# Patient Record
Sex: Female | Born: 1965 | Race: White | Hispanic: No | Marital: Married
Health system: Midwestern US, Academic
[De-identification: ages and names within clinical notes are randomized; demographics above are authoritative.]

---

## 2017-02-19 ENCOUNTER — Encounter: Admit: 2017-02-19 | Discharge: 2017-02-19 | Payer: BC Managed Care – PPO

## 2017-02-19 ENCOUNTER — Observation Stay: Admit: 2017-02-19 | Discharge: 2017-02-20 | Payer: BC Managed Care – PPO

## 2017-02-19 ENCOUNTER — Emergency Department: Admit: 2017-02-19 | Discharge: 2017-02-19 | Payer: BC Managed Care – PPO

## 2017-02-19 DIAGNOSIS — E039 Hypothyroidism, unspecified: ICD-10-CM

## 2017-02-19 DIAGNOSIS — C50919 Malignant neoplasm of unspecified site of unspecified female breast: ICD-10-CM

## 2017-02-19 DIAGNOSIS — C801 Malignant (primary) neoplasm, unspecified: Principal | ICD-10-CM

## 2017-02-19 DIAGNOSIS — C569 Malignant neoplasm of unspecified ovary: ICD-10-CM

## 2017-02-19 LAB — POC TROPONIN: Lab: 0 ng/mL (ref 0.00–0.05)

## 2017-02-19 LAB — COMPREHENSIVE METABOLIC PANEL: Lab: 127 MMOL/L — ABNORMAL LOW (ref 137–147)

## 2017-02-19 LAB — URINALYSIS DIPSTICK
Lab: 7 mg/dL (ref 5.0–8.0)
Lab: NEGATIVE g/dL (ref 6.0–8.0)
Lab: NEGATIVE mg/dL (ref 0.4–1.00)
Lab: NEGATIVE mg/dL (ref 8.5–10.6)

## 2017-02-19 LAB — CBC AND DIFF: Lab: 11 10*3/uL (ref 4.5–11.0)

## 2017-02-19 LAB — URINALYSIS, MICROSCOPIC

## 2017-02-19 MED ORDER — SODIUM CHLORIDE 0.9 % IJ SOLN
50 mL | Freq: Once | INTRAVENOUS | 0 refills | Status: CP
Start: 2017-02-19 — End: ?
  Administered 2017-02-20: 01:00:00 50 mL via INTRAVENOUS

## 2017-02-19 MED ORDER — POTASSIUM CHLORIDE IN 0.9%NACL 20 MEQ/L IV SOLP
1000 mL | INTRAVENOUS | 0 refills | Status: DC
Start: 2017-02-19 — End: 2017-02-20
  Administered 2017-02-20: 05:00:00 1000 mL via INTRAVENOUS

## 2017-02-19 MED ORDER — ENOXAPARIN 40 MG/0.4 ML SC SYRG
40 mg | Freq: Every day | SUBCUTANEOUS | 0 refills | Status: DC
Start: 2017-02-19 — End: 2017-02-20

## 2017-02-19 MED ORDER — POTASSIUM CHLORIDE 20 MEQ PO TBTQ
20 meq | Freq: Every day | ORAL | 0 refills | Status: DC
Start: 2017-02-19 — End: 2017-02-20
  Administered 2017-02-20: 14:00:00 20 meq via ORAL

## 2017-02-19 MED ORDER — ONDANSETRON HCL (PF) 4 MG/2 ML IJ SOLN
4 mg | INTRAVENOUS | 0 refills | Status: DC | PRN
Start: 2017-02-19 — End: 2017-02-20

## 2017-02-19 MED ORDER — LEVOTHYROXINE 100 MCG PO TAB
100 ug | Freq: Every day | ORAL | 0 refills | Status: DC
Start: 2017-02-19 — End: 2017-02-20
  Administered 2017-02-20: 12:00:00 100 ug via ORAL

## 2017-02-19 MED ORDER — IOPAMIDOL 76 % IV SOLN
100 mL | Freq: Once | INTRAVENOUS | 0 refills | Status: CP
Start: 2017-02-19 — End: ?
  Administered 2017-02-20: 01:00:00 100 mL via INTRAVENOUS

## 2017-02-19 MED ORDER — ACETAMINOPHEN 325 MG PO TAB
650 mg | ORAL | 0 refills | Status: DC | PRN
Start: 2017-02-19 — End: 2017-02-20

## 2017-02-19 MED ORDER — SODIUM CHLORIDE 0.9 % IV SOLP
1000 mL | INTRAVENOUS | 0 refills | Status: CP
Start: 2017-02-19 — End: ?
  Administered 2017-02-19: 23:00:00 1000 mL via INTRAVENOUS

## 2017-02-19 MED ORDER — CEPHALEXIN 500 MG PO CAP
500 mg | Freq: Two times a day (BID) | ORAL | 0 refills | Status: DC
Start: 2017-02-19 — End: 2017-02-20
  Administered 2017-02-20 (×2): 500 mg via ORAL

## 2017-02-19 MED ORDER — DOCUSATE SODIUM 100 MG PO CAP
100 mg | Freq: Every day | ORAL | 0 refills | Status: DC | PRN
Start: 2017-02-19 — End: 2017-02-20

## 2017-02-20 ENCOUNTER — Observation Stay: Admit: 2017-02-20 | Discharge: 2017-02-20 | Payer: BC Managed Care – PPO

## 2017-02-20 ENCOUNTER — Emergency Department: Admit: 2017-02-19 | Discharge: 2017-02-19 | Payer: BC Managed Care – PPO

## 2017-02-20 DIAGNOSIS — N390 Urinary tract infection, site not specified: ICD-10-CM

## 2017-02-20 DIAGNOSIS — R55 Syncope and collapse: ICD-10-CM

## 2017-02-20 DIAGNOSIS — Z9221 Personal history of antineoplastic chemotherapy: ICD-10-CM

## 2017-02-20 DIAGNOSIS — E871 Hypo-osmolality and hyponatremia: Principal | ICD-10-CM

## 2017-02-20 DIAGNOSIS — Z8543 Personal history of malignant neoplasm of ovary: ICD-10-CM

## 2017-02-20 DIAGNOSIS — K7689 Other specified diseases of liver: ICD-10-CM

## 2017-02-20 DIAGNOSIS — E039 Hypothyroidism, unspecified: ICD-10-CM

## 2017-02-20 DIAGNOSIS — Z853 Personal history of malignant neoplasm of breast: ICD-10-CM

## 2017-02-20 LAB — BASIC METABOLIC PANEL
Lab: 0.8 mg/dL (ref 0.4–1.00)
Lab: 108 MMOL/L (ref 98–110)
Lab: 113 mg/dL — ABNORMAL HIGH (ref 70–100)
Lab: 137 MMOL/L (ref 137–147)
Lab: 16 mg/dL (ref 7–25)
Lab: 24 MMOL/L (ref 21–30)
Lab: 3.3 MMOL/L — ABNORMAL LOW (ref 3.5–5.1)
Lab: 7.8 mg/dL — ABNORMAL LOW (ref 8.5–10.6)
Lab: >60 mL/min (ref >60)
Lab: >60 mL/min (ref >60)
Lab: 109 MMOL/L (ref 98–110)
Lab: 125 mg/dL — ABNORMAL HIGH (ref 70–100)
Lab: 138 MMOL/L (ref 137–147)
Lab: 25 MMOL/L (ref 21–30)
Lab: 4.3 MMOL/L (ref 3.5–5.1)
Lab: 5 (ref 3–12)
Lab: 56 mL/min — ABNORMAL LOW (ref >60)
Lab: >60 mL/min (ref >60)
Lab: 0.9 mg/dL (ref 0.4–1.00)
Lab: 1 mg/dL — ABNORMAL HIGH (ref 0.4–1.00)
Lab: 105 MMOL/L (ref 98–110)
Lab: 107 mg/dL — ABNORMAL HIGH (ref 70–100)
Lab: 109 MMOL/L (ref 98–110)
Lab: 128 mg/dL — ABNORMAL HIGH (ref 70–100)
Lab: 137 MMOL/L (ref 137–147)
Lab: 139 MMOL/L (ref 137–147)
Lab: 16 mg/dL (ref 7–25)
Lab: 17 mg/dL (ref 7–25)
Lab: 24 MMOL/L (ref 21–30)
Lab: 26 MMOL/L (ref 21–30)
Lab: 4.3 MMOL/L (ref 3.5–5.1)
Lab: 4.4 MMOL/L (ref 3.5–5.1)
Lab: 58 mL/min — ABNORMAL LOW (ref >60)
Lab: 59 mL/min — ABNORMAL LOW (ref >60)
Lab: 9.2 mg/dL (ref 8.5–10.6)
Lab: 9.6 mg/dL (ref 8.5–10.6)
Lab: >60 mL/min (ref >60)
Lab: >60 mL/min (ref >60)
Lab: 6 (ref 3–12)
Lab: 6 (ref 3–12)

## 2017-02-20 LAB — CULTURE-URINE W/SENSITIVITY

## 2017-02-20 LAB — PHOSPHORUS
Lab: 3 mg/dL (ref 2.0–4.0)
Lab: 3.6 mg/dL — ABNORMAL HIGH (ref 2.0–4.0)

## 2017-02-20 LAB — CBC AND DIFF
Lab: 1 % (ref 0–2)
Lab: 1 % (ref 0–5)
Lab: 12 g/dL (ref 12.0–15.0)
Lab: 13 % (ref 11–15)
Lab: 18 % — ABNORMAL LOW (ref 24–44)
Lab: 217 10*3/uL (ref 150–400)
Lab: 30 pg (ref 26–34)
Lab: 33 g/dL (ref 32.0–36.0)
Lab: 37 % (ref 36–45)
Lab: 4.1 M/UL (ref 4.0–5.0)
Lab: 5.7 10*3/uL (ref 1.8–7.0)
Lab: 6 % (ref 4–12)
Lab: 7.6 10*3/uL (ref 4.5–11.0)
Lab: 7.8 FL (ref 7–11)
Lab: 74 % (ref 41–77)
Lab: 90 FL (ref 80–100)

## 2017-02-20 LAB — MAGNESIUM
Lab: 1.9 mg/dL (ref 1.6–2.6)
Lab: 2.1 mg/dL — ABNORMAL LOW (ref 1.6–2.6)

## 2017-02-20 LAB — OSMOLALITY: Lab: 266 mosm/kg — ABNORMAL LOW (ref 280–307)

## 2017-02-20 LAB — CREATININE-URINE RANDOM: Lab: 18 mg/dL

## 2017-02-20 LAB — OSMOLALITY-URINE RANDOM: Lab: 142 mosm/kg (ref 50–1400)

## 2017-02-20 LAB — CORTISOL,RANDOM: Lab: 7.4 ug/dL (ref 5.0–20.0)

## 2017-02-20 LAB — TSH WITH FREE T4 REFLEX: Lab: 1.1 uU/mL (ref 0.35–5.00)

## 2017-02-20 LAB — SODIUM-URINE RANDOM: Lab: 23 MMOL/L

## 2017-02-20 MED ORDER — DEXTROSE 5% IN WATER IV SOLP
INTRAVENOUS | 0 refills | Status: DC
Start: 2017-02-20 — End: 2017-02-20
  Administered 2017-02-20: 07:00:00 1000.000 mL via INTRAVENOUS

## 2017-02-20 MED ORDER — CEPHALEXIN 500 MG PO CAP
500 mg | ORAL_CAPSULE | Freq: Two times a day (BID) | ORAL | 0 refills | Status: AC
Start: 2017-02-20 — End: ?

## 2017-02-20 MED ORDER — PERFLUTREN LIPID MICROSPHERES 1.1 MG/ML IV SUSP
1-20 mL | Freq: Once | INTRAVENOUS | 0 refills | Status: CP
Start: 2017-02-20 — End: ?
  Administered 2017-02-20: 16:00:00 2 mL via INTRAVENOUS

## 2017-02-20 MED ORDER — POTASSIUM CHLORIDE 20 MEQ PO TBTQ
20 meq | Freq: Once | ORAL | 0 refills | Status: CP
Start: 2017-02-20 — End: ?
  Administered 2017-02-20: 07:00:00 20 meq via ORAL

## 2017-02-20 NOTE — Case Management (ED)
Case Management Admission Assessment    NAME:Robin Jackson                          MRN: [id number]             DOB:08-Dec-1965          AGE: 51 y.o.  ADMISSION DATE: 02/19/2017             DAYS ADMITTED: LOS: 0 days      Today???s Date: 02/20/2017    Source of Information: Patient and EMR    Plan  Plan: CM Assessment, Psychosocial Assessment, Assist PRN with SW/NCM Services, Discharge Planning for Home Anticipated    Patient Address/Phone  [address]  [name] [address]  [phone] (home)     Emergency Contact  Extended Emergency Contact Information  Primary Emergency Contact: [name], [name]  Home Phone: [phone]  Relation: Spouse    Healthcare Directive  Healthcare Directive: Yes, patient has a healthcare directive  Type of Healthcare Directive: Durable power of attorney for healthcare  Location of Healthcare Directive: Patient does not have it with him/her  Would patient like to fill out a (a new) Healthcare Directive?: No, patient declined      Transportation  Does the patient need discharge transport arranged?: No  Transportation Name, Phone and Availability #1: Husband [name] [phone]  Does the patient use Medicaid Transportation?: No    Expected Discharge Date  Expected Discharge Date: 02/20/17    Living Situation Prior to Admission  ? Living Arrangements  Type of Residence: Home, independent  Living Arrangements: Spouse/significant other  Assistance needed prior to admit or anticipated on discharge: No  Who provides assistance or could if needed?: [name]  Are they in good health?: Yes  Can support system provide 24/7 care if needed?: Yes   Address and phone numbers verified.    ? Level of Function   Prior level of function: Independent  ? Cognitive Abilities   Cognitive Abilities: Alert and Oriented, Engages in problem solving and planning, Participates in decision making    Financial Resources  ? Coverage  Primary Insurance: [employer] Coverage: RX    ? 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 Financial Assistance Needed?      Psychosocial Needs  ? Mental Health  Mental Health History: No  ? Substance Use History  Substance Use History Screen: No  ? Other      Current/Previous Services  ? PCP  Orson Gear, [phone], [phone]  ? Pharmacy    [facility] [address], [place] - [address] [place] [address] [place] [address]  [place] [address]  Phone: [phone] Fax: [phone]    ? Durable Medical Equipment   Durable Medical Equipment at home: None  ? Home Health  Receiving home health: No  ? Hemodialysis or Peritoneal Dialysis  Undergoing hemodialysis or peritoneal dialysis: No  ? Tube/Enteral Feeds  Receive tube/enteral feeds: No  ? Infusion  Receive infusions: No  ? Private Duty  Private duty help used: No  ? Home and Community Based Services  Home and community based services: No  ? [employer]: N/A  ? Hospice  Hospice: No  ? Outpatient Therapy  PT: No  OT: No  SLP: No  ? Skilled Nursing Facility/Nursing Home  SNF: No  NH: No  ? Inpatient Rehab  IPR:  No  ? Long-Term Acute Care Hospital  LTACH: No  ? Acute Hospital Stay  Acute Hospital Stay: No    Zena Amos, RNC-MNN, BSN  Nurse Case Manager - OB  Office: [phone]  Pager: [phone]  Work Cell: [phone]

## 2017-02-20 NOTE — Progress Notes
Pt A&Ox4. Patient allergy band in place.Pt ID band verified with patient.  Profile completed, Fall risk in place, care plan/education updated,  call light within reach

## 2017-02-20 NOTE — Care Coordination-Inpatient
Pt assigned to [facility], please page [phone] prior to 8 am on 02/20/17, then page [phone] if any questions

## 2017-02-20 NOTE — ED Notes
Orthostatics completed, pt able to stand without dizziness and then ambulated unassisted to the bathroom with a steady gait.

## 2017-02-20 NOTE — ED Notes
Report to Alex, RN

## 2017-02-20 NOTE — Progress Notes
Lorina Rabon discharged on 02/20/2017.   Marland Kitchen  Discharge instructions reviewed with patient.  Valuables returned:   Personal Items / Valuables: Cell Phone, Eyeglasses/Contacts, Clothing.  Home medications: N/A   .  Functional assessment at discharge complete: Yes . Discharge instructions reviewed with patient all questions were answered at this time. IV was removed and tele pack removed. Patient transported to lobby for transport home from family.

## 2017-02-20 NOTE — ED Notes
[id number] @ 2127. Please call Edmonia Lynch, RN at [phone].

## 2019-03-01 IMAGING — CT Head^1_TRAUMA_HEAD_C_SPINE (Adult)
1 series · 12 of 14 positions shown, 15 images · non-contrast
Comparison: none

[Series 2: brain w/o 4.8 brain · axial · non-contrast · 0.55mm/px · z∈[+291,+425]mm · 12 of 33 slices shown, 15 images]
[im 3/33  soft-tissue]
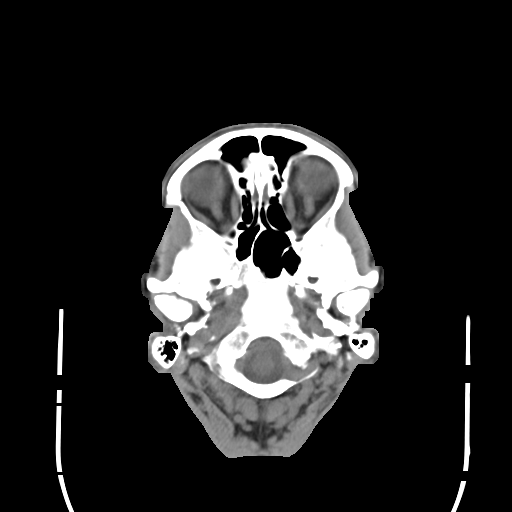
[im 3/33  bone]
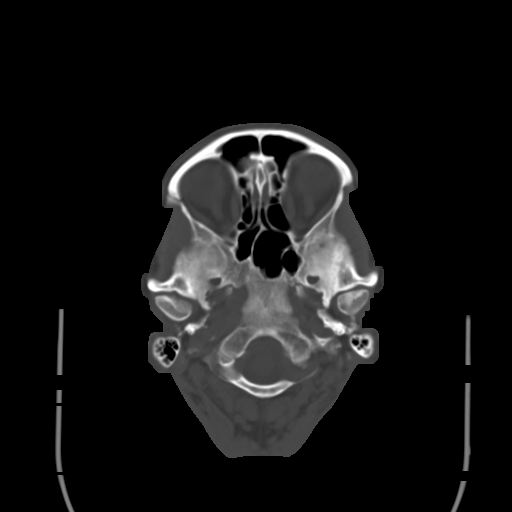
[im 5/33  bone]
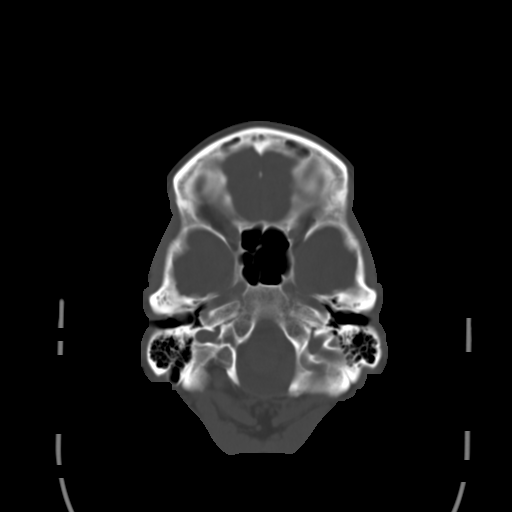
[im 8/33  bone]
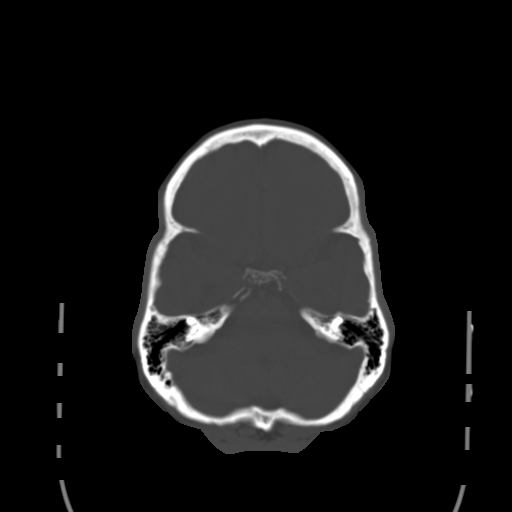
[im 10/33  bone]
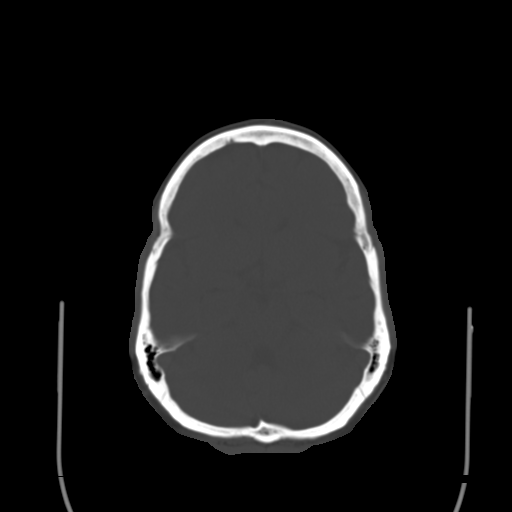
[im 13/33  soft-tissue]
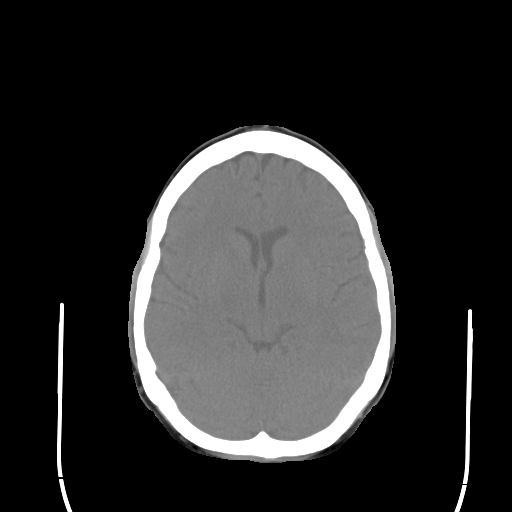
[im 13/33  bone]
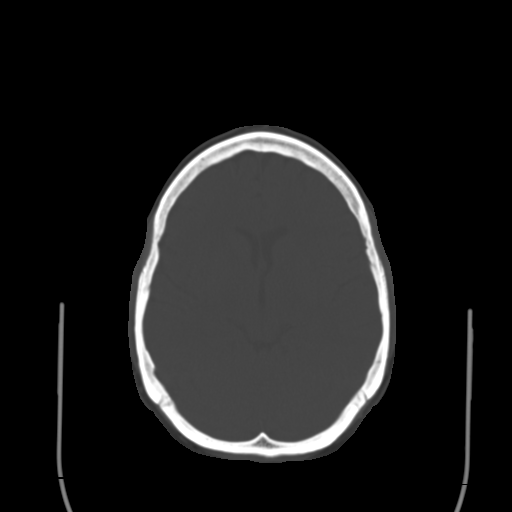
[im 15/33  bone]
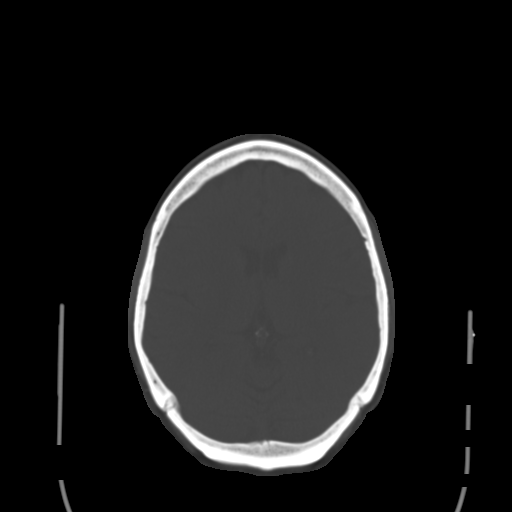
[im 18/33  bone]
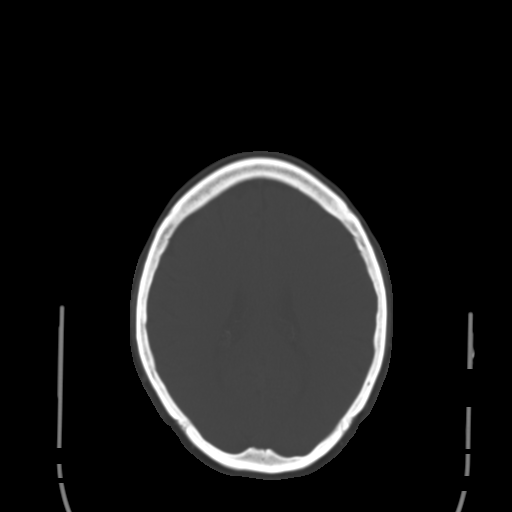
[im 20/33  bone]
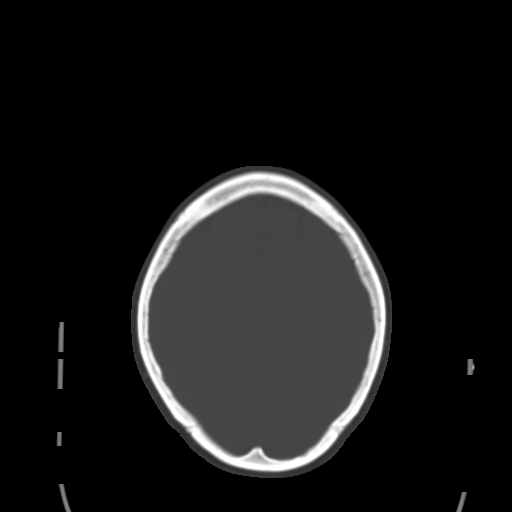
[im 23/33  soft-tissue]
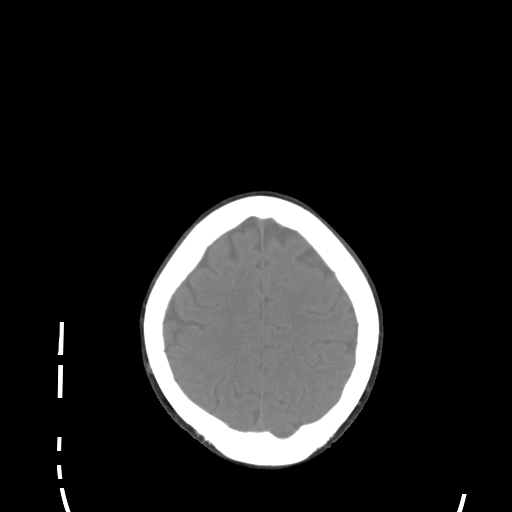
[im 23/33  bone]
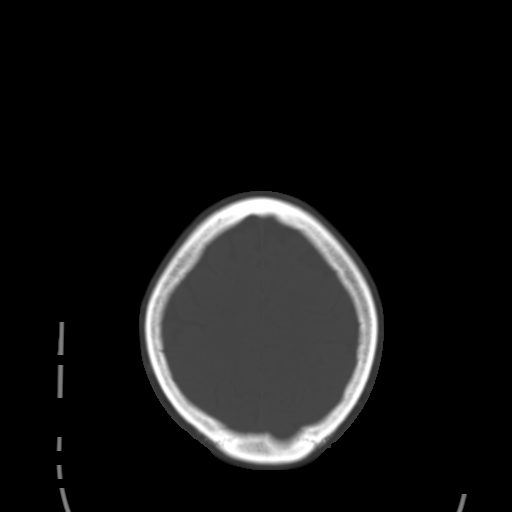
[im 25/33  bone]
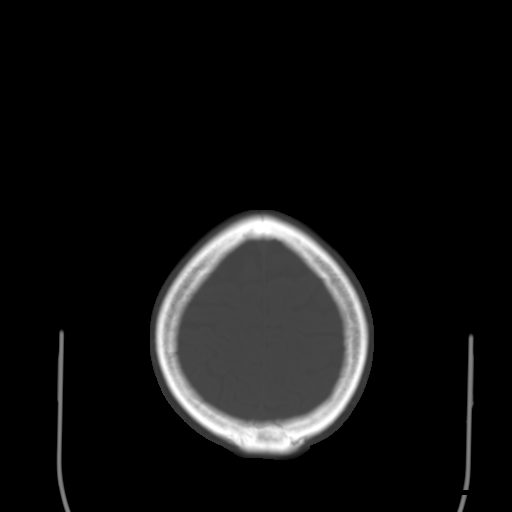
[im 28/33  bone]
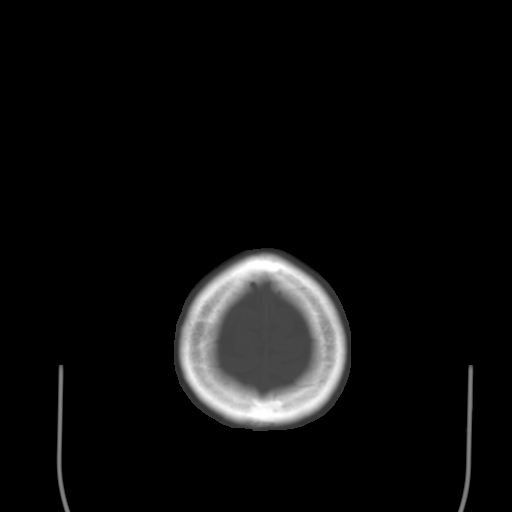
[im 30/33  bone]
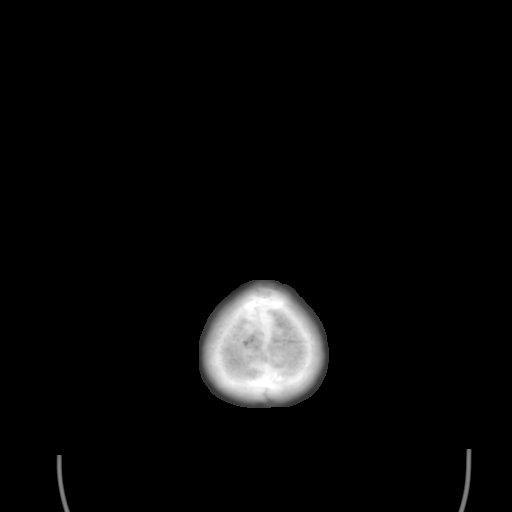

[12 of 14 positions shown; findings below may reference images not displayed]

EXAM
COMPUTED TOMOGRAPHY, HEAD OR BRAIN; WITHOUT CONTRAST MATERIAL, CPT 14644

INDICATION
syncope with head strike
SYNCOPE WITH HEAD STRIKE, HEADACHE, RT ORBITAL PAIN/ABRASION - DENIES PREG,
HX OF HYSTERECTOMY - AK

TECHNIQUE
CT evaluation of the brain was performed without contrast.
[All CT scans at this facility use dose modulation, iterative reconstruction, and/or weight based
dosing when appropriate to reduce radiation dose to as low as reasonably achievable.]

COMPARISONS
None

FINDINGS
The cerebral ventricles and sulci appear normal. There is no evidence of acute intracranial
hemorrhage. There is no mass lesion, acute hemorrhage, or visualized infarct. The skull is normal.

IMPRESSION
No significant CT abnormality of the brain. There is no evidence of acute hemorrhage, infarct or
mass lesion.

## 2019-09-12 IMAGING — CR UP_EXM
2 series · 2 of 2 positions shown · non-contrast
Comparison: none

[hand]
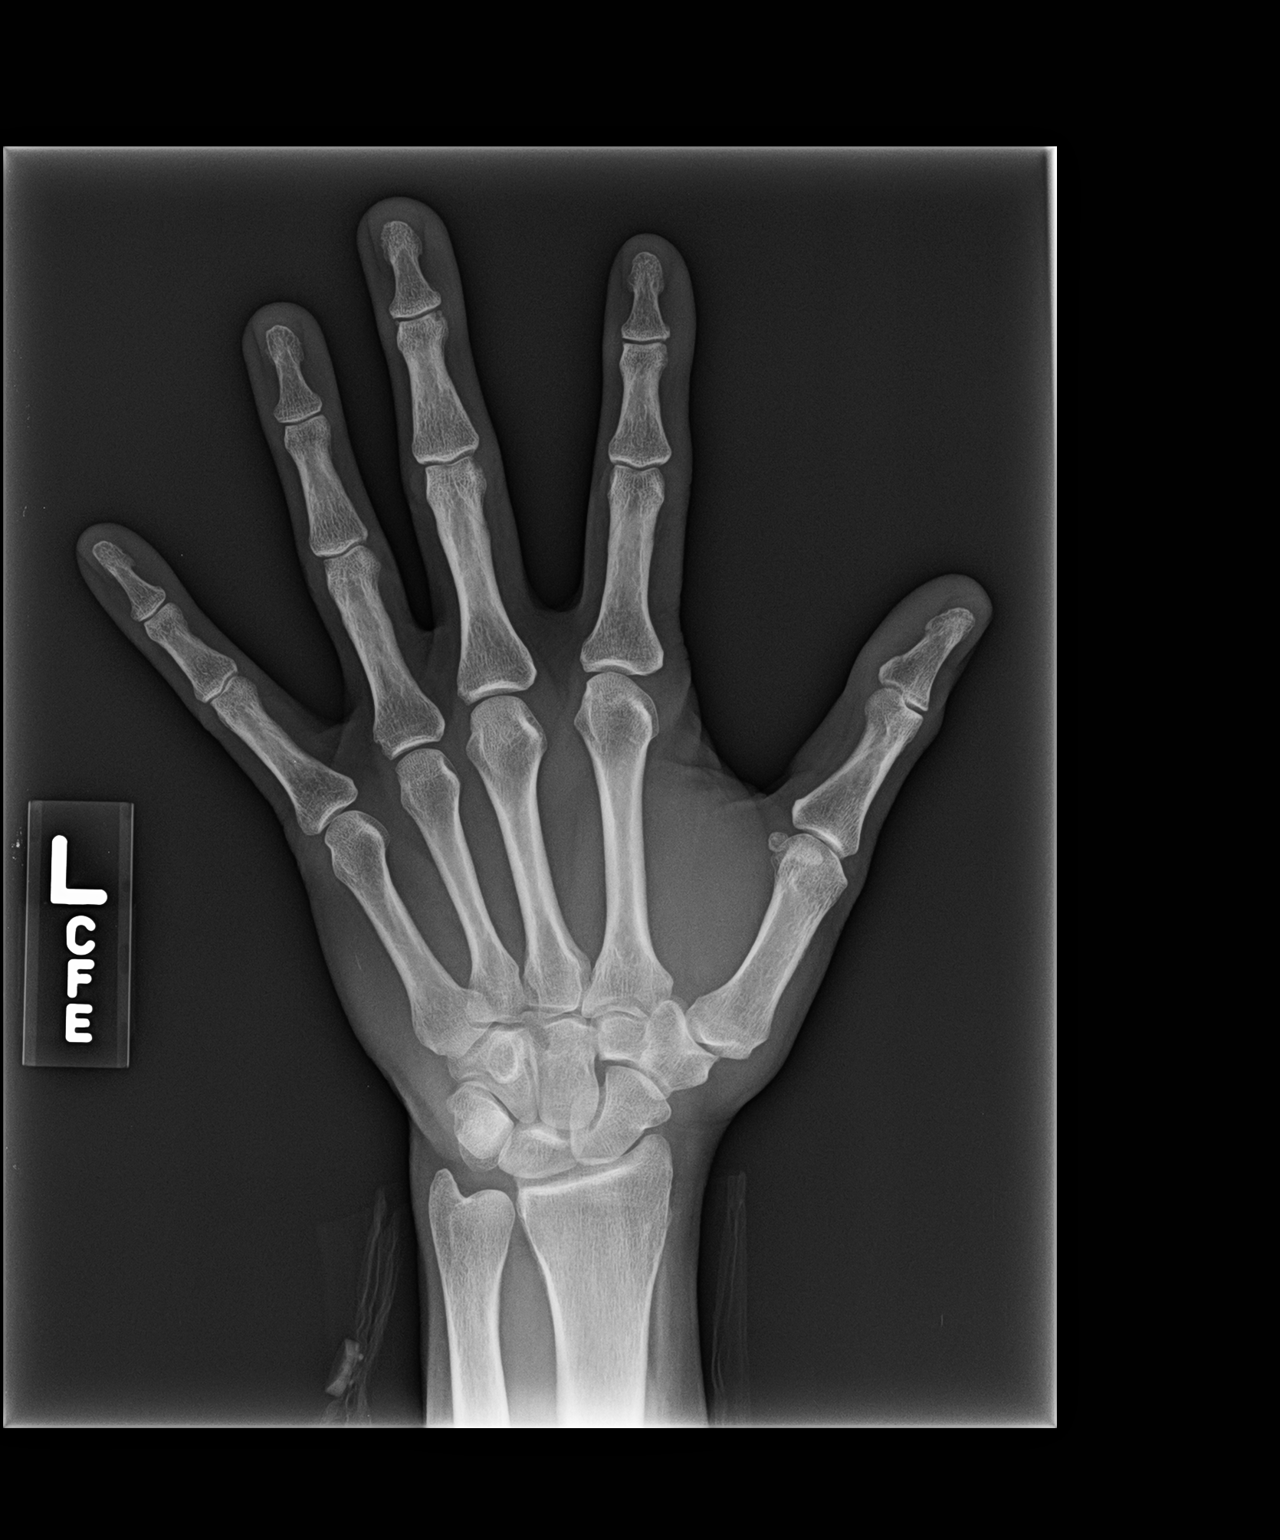

[hand lat]
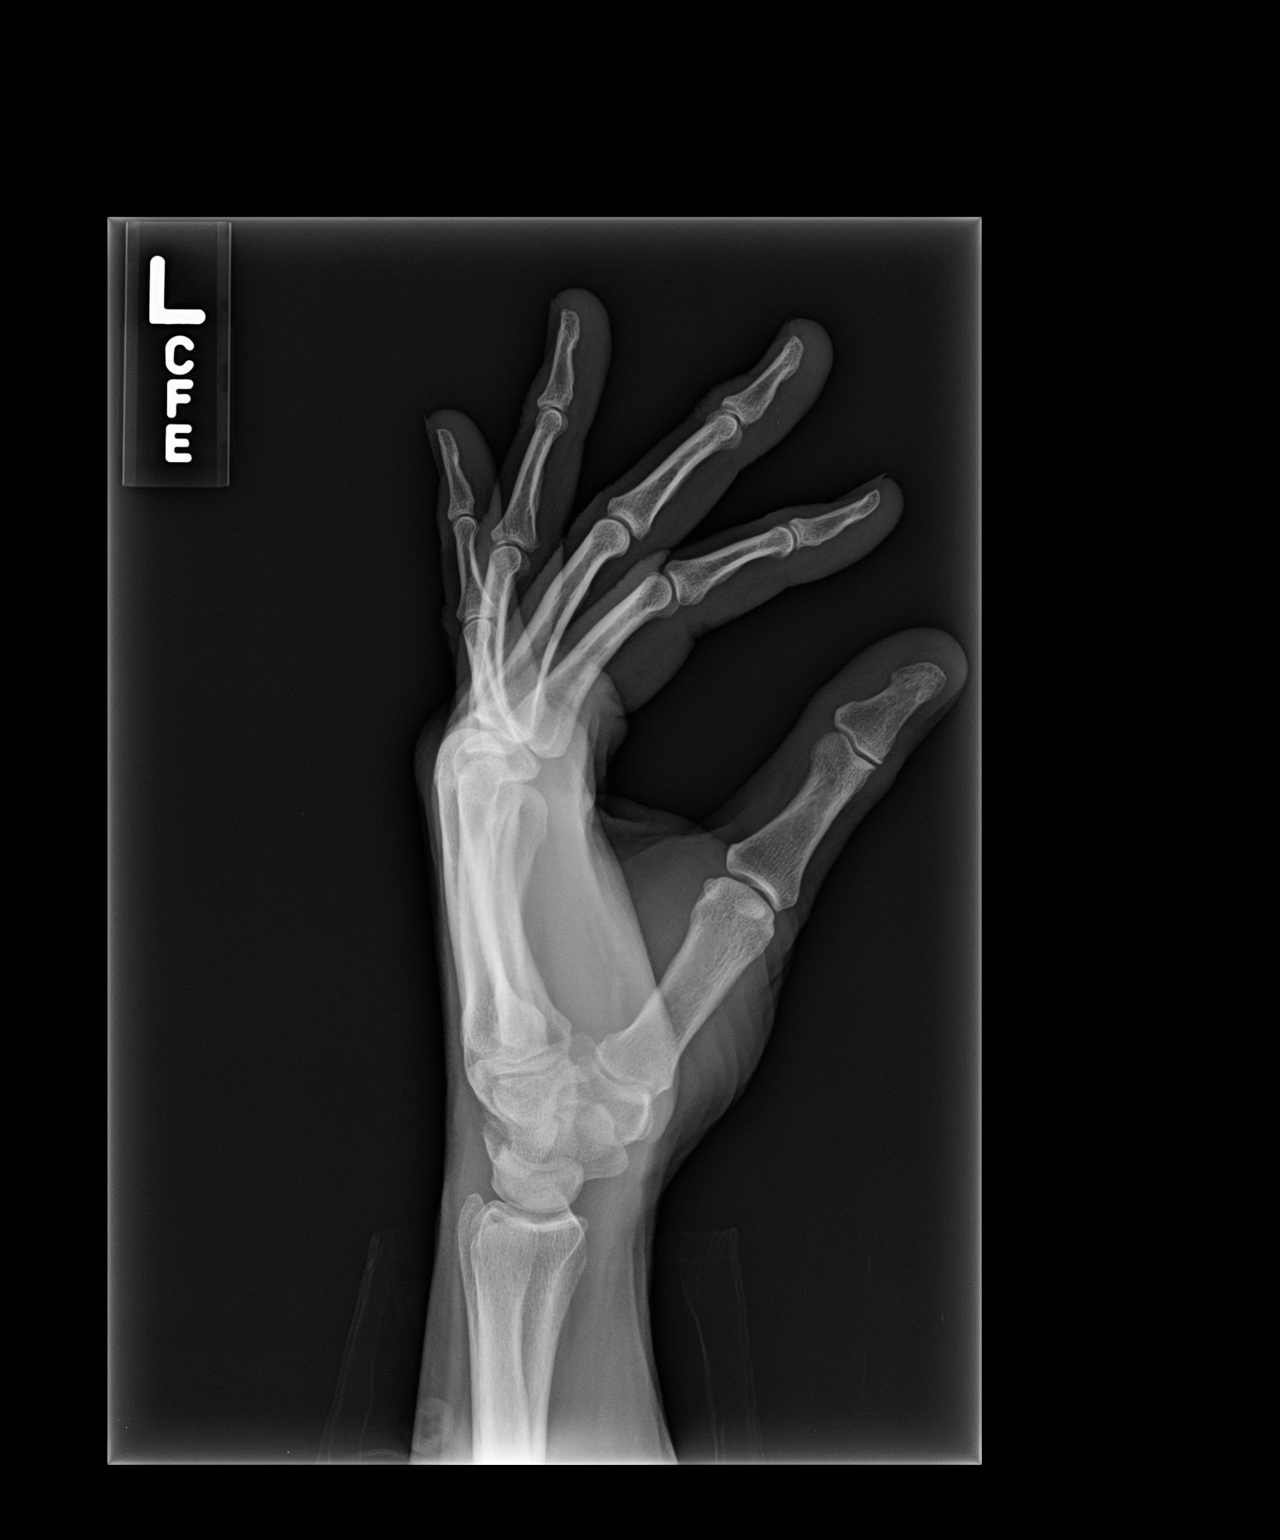

[2 of 2 positions shown; findings below may reference images not displayed]

EXAM

2 views left hand

INDICATION

thumb injury
PT. HIT LT 1ST DIGIT ON DOOR JAM X1 WEEK AGO. C/O PAIN FROM LT 1ST DIGIT
INTERPHALANGEAL JOINT. CE/CK

TECHNIQUE

AP and lateral views were obtained

COMPARISONS

Two views left hand February 15, 2017

FINDINGS

There is no acute fracture, dislocation, or other acute osseus abnormality. The soft tissues are
unremarkable.

IMPRESSION

1. No acute osseus abnormality.
